# Patient Record
Sex: Female | Born: 1985 | Hispanic: Yes | Marital: Single | Smoking: Never smoker
Health system: Southern US, Community
[De-identification: ages and names within clinical notes are randomized; demographics above are authoritative.]

## PROBLEM LIST (undated history)

## (undated) ENCOUNTER — Inpatient Hospital Stay: Payer: Self-pay

## (undated) DIAGNOSIS — N6489 Other specified disorders of breast: Secondary | ICD-10-CM

## (undated) DIAGNOSIS — Z87440 Personal history of urinary (tract) infections: Secondary | ICD-10-CM

## (undated) DIAGNOSIS — Z8759 Personal history of other complications of pregnancy, childbirth and the puerperium: Secondary | ICD-10-CM

## (undated) HISTORY — DX: Personal history of other complications of pregnancy, childbirth and the puerperium: Z87.59

## (undated) HISTORY — DX: Personal history of urinary (tract) infections: Z87.440

## (undated) HISTORY — DX: Other specified disorders of breast: N64.89

---

## 2007-07-09 ENCOUNTER — Emergency Department: Payer: Self-pay | Admitting: Emergency Medicine

## 2009-03-03 ENCOUNTER — Ambulatory Visit: Payer: Self-pay | Admitting: Family Medicine

## 2009-04-23 ENCOUNTER — Ambulatory Visit: Payer: Self-pay | Admitting: Certified Nurse Midwife

## 2009-07-10 ENCOUNTER — Observation Stay: Payer: Self-pay

## 2016-01-27 LAB — OB RESULTS CONSOLE PLATELET COUNT: Platelets: 237 10*3/uL

## 2016-01-27 LAB — OB RESULTS CONSOLE HGB/HCT, BLOOD
HCT: 33 %
Hemoglobin: 11.4 g/dL

## 2016-01-27 LAB — OB RESULTS CONSOLE GC/CHLAMYDIA
CHLAMYDIA, DNA PROBE: NEGATIVE
Gonorrhea: NEGATIVE

## 2016-01-27 LAB — OB RESULTS CONSOLE RUBELLA ANTIBODY, IGM: RUBELLA: IMMUNE

## 2016-01-27 LAB — OB RESULTS CONSOLE ABO/RH: RH TYPE: POSITIVE

## 2016-01-27 LAB — OB RESULTS CONSOLE HIV ANTIBODY (ROUTINE TESTING): HIV: NONREACTIVE

## 2016-01-27 LAB — OB RESULTS CONSOLE RPR: RPR: NONREACTIVE

## 2016-01-27 LAB — OB RESULTS CONSOLE HEPATITIS B SURFACE ANTIGEN: Hepatitis B Surface Ag: NEGATIVE

## 2016-01-27 LAB — SICKLE CELL SCREEN: SICKLE CELL SCREEN: NORMAL

## 2016-01-27 LAB — OB RESULTS CONSOLE ANTIBODY SCREEN: ANTIBODY SCREEN: NEGATIVE

## 2016-01-27 LAB — OB RESULTS CONSOLE VARICELLA ZOSTER ANTIBODY, IGG: VARICELLA IGG: IMMUNE

## 2016-02-29 ENCOUNTER — Other Ambulatory Visit: Payer: Self-pay | Admitting: Certified Nurse Midwife

## 2016-02-29 DIAGNOSIS — N6489 Other specified disorders of breast: Secondary | ICD-10-CM

## 2016-03-07 ENCOUNTER — Ambulatory Visit
Admission: RE | Admit: 2016-03-07 | Discharge: 2016-03-07 | Disposition: A | Discharge status: Home / Self Care | Payer: Managed Care, Other (non HMO) | Source: Ambulatory Visit | Attending: Certified Nurse Midwife | Admitting: Certified Nurse Midwife

## 2016-03-07 ENCOUNTER — Encounter (HOSPITAL_COMMUNITY): Payer: Self-pay

## 2016-03-07 DIAGNOSIS — N6489 Other specified disorders of breast: Secondary | ICD-10-CM | POA: Insufficient documentation

## 2016-03-07 HISTORY — DX: Other specified disorders of breast: N64.89

## 2016-04-25 NOTE — L&D Delivery Note (Signed)
Delivery Note At 8:44 AM a viable female was delivered via Vaginal, Spontaneous Delivery (Presentation: OA).  APGAR: 7, 9; weight 7 lb 14.6 oz (3590 g).   Placenta status: spontaneous, intact.  Cord: 3VC without following complications: .  Cord pH: N/A  Anesthesia:  None Episiotomy:  None Lacerations:  none Suture Repair: N/A Est. Blood Loss (mL):  200mL  Mom to postpartum.  Baby to Couplet care / Skin to Skin.  Vena Austriandreas Aminah Zabawa 09/09/2016, 9:04 AM

## 2016-06-20 LAB — OB RESULTS CONSOLE HGB/HCT, BLOOD
HCT: 35 %
Hemoglobin: 11.4 g/dL

## 2016-06-20 LAB — OB RESULTS CONSOLE RPR: RPR: NONREACTIVE

## 2016-06-20 LAB — OB RESULTS CONSOLE PLATELET COUNT: PLATELETS: 180 10*3/uL

## 2016-06-20 LAB — OB RESULTS CONSOLE HIV ANTIBODY (ROUTINE TESTING): HIV: NONREACTIVE

## 2016-07-01 DIAGNOSIS — N159 Renal tubulo-interstitial disease, unspecified: Secondary | ICD-10-CM | POA: Insufficient documentation

## 2016-07-01 LAB — GLUCOSE, 1 HOUR GESTATIONAL: GLUCOSE 1 HOUR: 93

## 2016-07-04 ENCOUNTER — Encounter: Payer: Self-pay | Admitting: Obstetrics and Gynecology

## 2016-07-07 ENCOUNTER — Ambulatory Visit (INDEPENDENT_AMBULATORY_CARE_PROVIDER_SITE_OTHER): Payer: Managed Care, Other (non HMO) | Admitting: Obstetrics & Gynecology

## 2016-07-07 VITALS — BP 90/60 | Wt 109.0 lb

## 2016-07-07 DIAGNOSIS — Z23 Encounter for immunization: Secondary | ICD-10-CM

## 2016-07-07 DIAGNOSIS — Z3490 Encounter for supervision of normal pregnancy, unspecified, unspecified trimester: Secondary | ICD-10-CM

## 2016-07-07 DIAGNOSIS — Z3A31 31 weeks gestation of pregnancy: Secondary | ICD-10-CM

## 2016-07-07 NOTE — Addendum Note (Signed)
Addended by: Cornelius MorasPATTERSON, Navaya Wiatrek D on: 07/07/2016 02:58 PM   Modules accepted: Orders

## 2016-07-07 NOTE — Progress Notes (Signed)
TDaP today.  No c/os.  PNV, FMC, PTL precautions, Back pain discussed (heat, compression, Tylenol).

## 2016-07-15 ENCOUNTER — Telehealth: Payer: Self-pay

## 2016-07-15 NOTE — Telephone Encounter (Signed)
FMLA/DISABILITY form for [employer] filled out and given to [place] for processing.

## 2016-07-21 ENCOUNTER — Ambulatory Visit (INDEPENDENT_AMBULATORY_CARE_PROVIDER_SITE_OTHER): Payer: Managed Care, Other (non HMO) | Admitting: Advanced Practice Midwife

## 2016-07-21 VITALS — BP 98/56 | Wt 110.0 lb

## 2016-07-21 DIAGNOSIS — Z3A33 33 weeks gestation of pregnancy: Secondary | ICD-10-CM

## 2016-07-21 NOTE — Progress Notes (Signed)
Doing well. Discussion regarding when to go out of work. Pt plans for around 36 weeks and will need a note at next visit. Pt desires BTL- signing consent today. Measuring 30 cm today. Growth scan nv.

## 2016-08-04 ENCOUNTER — Ambulatory Visit (INDEPENDENT_AMBULATORY_CARE_PROVIDER_SITE_OTHER): Payer: Managed Care, Other (non HMO)

## 2016-08-04 ENCOUNTER — Ambulatory Visit (INDEPENDENT_AMBULATORY_CARE_PROVIDER_SITE_OTHER): Payer: Managed Care, Other (non HMO) | Admitting: Advanced Practice Midwife

## 2016-08-04 VITALS — BP 108/58 | Wt 110.0 lb

## 2016-08-04 DIAGNOSIS — R300 Dysuria: Secondary | ICD-10-CM

## 2016-08-04 DIAGNOSIS — Z3A38 38 weeks gestation of pregnancy: Secondary | ICD-10-CM | POA: Diagnosis not present

## 2016-08-04 DIAGNOSIS — O365930 Maternal care for other known or suspected poor fetal growth, third trimester, not applicable or unspecified: Secondary | ICD-10-CM | POA: Diagnosis not present

## 2016-08-04 DIAGNOSIS — Z3A35 35 weeks gestation of pregnancy: Secondary | ICD-10-CM

## 2016-08-04 LAB — POCT URINALYSIS DIPSTICK
Bilirubin, UA: NEGATIVE
Blood, UA: NEGATIVE
Glucose, UA: NEGATIVE
Ketones, UA: NEGATIVE
LEUKOCYTES UA: NEGATIVE
NITRITE UA: NEGATIVE
PH UA: 7.5 (ref 5.0–8.0)
PROTEIN UA: NEGATIVE
Spec Grav, UA: 1.02 (ref 1.010–1.025)
UROBILINOGEN UA: NEGATIVE U/dL — AB

## 2016-08-04 NOTE — Progress Notes (Signed)
c/o frequent urination- denies burning or irritation. Urine is light yellow and not cloudy and negative on dip. F/U scan today for growth and placentation. Baby is 6 pounds, 50%, AFI 11cm. Placenta measures 1.72cm from cervix. Work letter given to day for last day 4/14 until after pp recovery.

## 2016-08-04 NOTE — Progress Notes (Signed)
Lower back pain 

## 2016-08-12 ENCOUNTER — Ambulatory Visit (INDEPENDENT_AMBULATORY_CARE_PROVIDER_SITE_OTHER): Payer: Managed Care, Other (non HMO) | Admitting: Obstetrics and Gynecology

## 2016-08-12 VITALS — BP 100/60 | Wt 111.0 lb

## 2016-08-12 DIAGNOSIS — Z3490 Encounter for supervision of normal pregnancy, unspecified, unspecified trimester: Secondary | ICD-10-CM

## 2016-08-12 DIAGNOSIS — Z3A36 36 weeks gestation of pregnancy: Secondary | ICD-10-CM

## 2016-08-12 DIAGNOSIS — O4440 Low lying placenta NOS or without hemorrhage, unspecified trimester: Secondary | ICD-10-CM | POA: Insufficient documentation

## 2016-08-12 NOTE — Progress Notes (Signed)
No vb. No lof. Aptima/GBS today. Verified desire for BTL U/s nv for placentation with LLP

## 2016-08-16 LAB — STREP GP B NAA: Strep Gp B NAA: NEGATIVE

## 2016-08-16 LAB — GC/CHLAMYDIA PROBE AMP
Chlamydia trachomatis, NAA: NEGATIVE
NEISSERIA GONORRHOEAE BY PCR: NEGATIVE

## 2016-08-19 ENCOUNTER — Ambulatory Visit (INDEPENDENT_AMBULATORY_CARE_PROVIDER_SITE_OTHER): Payer: Managed Care, Other (non HMO) | Admitting: Certified Nurse Midwife

## 2016-08-19 ENCOUNTER — Ambulatory Visit (INDEPENDENT_AMBULATORY_CARE_PROVIDER_SITE_OTHER): Payer: Managed Care, Other (non HMO)

## 2016-08-19 VITALS — BP 90/60 | Ht 60.0 in | Wt 110.0 lb

## 2016-08-19 DIAGNOSIS — Z3A37 37 weeks gestation of pregnancy: Secondary | ICD-10-CM

## 2016-08-19 DIAGNOSIS — Z3490 Encounter for supervision of normal pregnancy, unspecified, unspecified trimester: Secondary | ICD-10-CM

## 2016-08-19 DIAGNOSIS — O4440 Low lying placenta NOS or without hemorrhage, unspecified trimester: Secondary | ICD-10-CM

## 2016-08-19 NOTE — Progress Notes (Signed)
U/S f/u today for placentation with LLP. Pt reports no problems.

## 2016-08-19 NOTE — Progress Notes (Signed)
Ultrasound today for placental location: posterior placenta 2.33cm from os advised patient that it is OK to try to deliver vaginally. If bleeding excessively as cervix dilates, Cesarean section would be done. Baby active. Having some irregular contractions and pain over symphysis pubis [facility]KC instructions given. Labor precautions. Breast and bottle/ BTL GBS negative ROB 1 weekx

## 2016-08-29 ENCOUNTER — Ambulatory Visit (INDEPENDENT_AMBULATORY_CARE_PROVIDER_SITE_OTHER): Payer: Managed Care, Other (non HMO) | Admitting: Advanced Practice Midwife

## 2016-08-29 VITALS — BP 100/62 | Wt 115.0 lb

## 2016-08-29 DIAGNOSIS — Z3A39 39 weeks gestation of pregnancy: Secondary | ICD-10-CM

## 2016-08-29 NOTE — Progress Notes (Signed)
Doing well today. Denies LOF, VB. Having some irregular contractions. Declines cervical exam today.

## 2016-08-29 NOTE — Patient Instructions (Signed)

## 2016-09-05 ENCOUNTER — Ambulatory Visit (INDEPENDENT_AMBULATORY_CARE_PROVIDER_SITE_OTHER): Payer: Managed Care, Other (non HMO) | Admitting: Obstetrics & Gynecology

## 2016-09-05 VITALS — BP 100/60 | Wt 114.0 lb

## 2016-09-05 DIAGNOSIS — Z3A40 40 weeks gestation of pregnancy: Secondary | ICD-10-CM

## 2016-09-05 DIAGNOSIS — Z3490 Encounter for supervision of normal pregnancy, unspecified, unspecified trimester: Secondary | ICD-10-CM

## 2016-09-05 NOTE — Progress Notes (Signed)
PNV, FMC, Labor precautions, Membranes stripped today, NST/AFI Thurs if still pregnant, IOL discussed for 41 weeks if needed (not yet scheduled)

## 2016-09-06 ENCOUNTER — Inpatient Hospital Stay
Admission: EM | Admit: 2016-09-06 | Discharge: 2016-09-06 | Disposition: A | Discharge status: Home / Self Care | Payer: Managed Care, Other (non HMO) | Source: Home / Self Care | Admitting: Obstetrics and Gynecology

## 2016-09-06 ENCOUNTER — Encounter: Payer: Self-pay | Admitting: *Deleted

## 2016-09-06 DIAGNOSIS — O368130 Decreased fetal movements, third trimester, not applicable or unspecified: Secondary | ICD-10-CM | POA: Diagnosis not present

## 2016-09-06 DIAGNOSIS — O368190 Decreased fetal movements, unspecified trimester, not applicable or unspecified: Secondary | ICD-10-CM

## 2016-09-06 DIAGNOSIS — Z3A40 40 weeks gestation of pregnancy: Secondary | ICD-10-CM | POA: Diagnosis not present

## 2016-09-06 NOTE — Discharge Instructions (Signed)
You are scheduled for induction of labor on 09/12/16 at 0800 AM.  Please call Labor and Delivery before coming to make sure there is a bed available.  The number is [phone].

## 2016-09-06 NOTE — OB Triage Note (Signed)
Recvd to OBS 3 per wheelchair from ED for c/o decreased fetal movement since last PM.  Changed to gown and to bed.  EFM applied.  Physical assessment completed.  Oriented to room and plan of care discussed.  Verbalized understanding and agrees with plan.

## 2016-09-06 NOTE — Progress Notes (Signed)
Discharge instructions given and explained.  Verbalized understanding.  Questions answered.  Signed copy on chart.  

## 2016-09-06 NOTE — Final Progress Note (Signed)
Physician Final Progress Note  Patient ID: Rebecca Sheppard MRN: [id number] DOB/AGE: 30/06/1985 30 y.o.  Admit date: 09/06/2016 Admitting provider: Vena AustriaAndreas Staebler, MD/ Rebecca Sheppard, CNM Discharge date: 09/06/2016   Admission Diagnoses: Decreased fetal movement at 40.1 weeks  Discharge Diagnoses: Reactive Non stress test  Consults: None  Significant Findings/ Diagnostic Studies: 31 year old G2 P1001 with EDC=09/05/2016 by LMP=11/30/2015 presents at 1940 wk1d with decreased fetal movement this AM. Seen in office yesterday and membranes were striped. Contracted through the night, but contractions spaced out this AM. No vaginal bleeding or LOF. Has not eaten or drank this AM. [facility] for early care, concerns for a low lying placenta (resolved on 4/27 ultrasound with placenta 2.33cm from os), history of pyelo with G1,  And a 19# weight gain with starting BMI=18.55 kg/m2. Social: Non smoker/ no alcohol Family HX: negative for ovarian or breast cancer  Exam: General: gravid female in NAD BP 100/69 (BP Location: Left Arm)   Pulse 75   Temp 97.7 F (36.5 C) (Oral)   Resp 16   Ht 5' (1.524 m)   Wt 51.7 kg (114 lb)   LMP 11/30/2015   BMI 22.26 kg/m    ABD: Vertex/ EFW 7# FHR: 120 baseline with accelerations to 150s, moderate variability Toco: occasional contraction, mostly uterine irritability Cervix: 3/80%/-1  A: IUP at 40wk1 days with reactive NST  P: D/C home with labor precautions [facility]-will need orders and H&P Rebecca Sheppard, CNM  Procedures: NST  Discharge Condition: stable  Disposition: Final discharge disposition not confirmed  Diet: Regular diet  Discharge Activity: Activity as tolerated  Discharge Instructions    Discharge patient    Complete by:  As directed    Discharge disposition:  01-Home or Self Care   Discharge patient date:  09/06/2016     Allergies  as of 09/06/2016   No Known Allergies     Medication List    STOP taking these medications   FLUARIX QUADRIVALENT 0.5 ML injection Generic drug:  Influenza vac split quadrivalent PF     TAKE these medications   prenatal multivitamin Tabs tablet Take 1 tablet by mouth daily at 12 noon.        Total time spent taking care of this patient: 20 minutes  Signed: Farrel Connersolleen Delaynee Sheppard 09/06/2016, 9:56 AM

## 2016-09-08 ENCOUNTER — Ambulatory Visit (INDEPENDENT_AMBULATORY_CARE_PROVIDER_SITE_OTHER): Payer: Managed Care, Other (non HMO)

## 2016-09-08 ENCOUNTER — Other Ambulatory Visit: Payer: Self-pay | Admitting: Obstetrics & Gynecology

## 2016-09-08 ENCOUNTER — Ambulatory Visit (INDEPENDENT_AMBULATORY_CARE_PROVIDER_SITE_OTHER): Payer: Managed Care, Other (non HMO) | Admitting: Obstetrics and Gynecology

## 2016-09-08 VITALS — BP 110/68 | Wt 116.0 lb

## 2016-09-08 DIAGNOSIS — Z3490 Encounter for supervision of normal pregnancy, unspecified, unspecified trimester: Secondary | ICD-10-CM

## 2016-09-08 DIAGNOSIS — Z3A40 40 weeks gestation of pregnancy: Secondary | ICD-10-CM

## 2016-09-08 DIAGNOSIS — O480 Post-term pregnancy: Secondary | ICD-10-CM | POA: Diagnosis not present

## 2016-09-08 DIAGNOSIS — Z3483 Encounter for supervision of other normal pregnancy, third trimester: Secondary | ICD-10-CM

## 2016-09-08 NOTE — Progress Notes (Signed)
Obstetric H&P   Chief Complaint: IOL  Prenatal Care Provider: [facility]  History of Present Illness: 31 y.o. G2P1001 6947w3d by 09/05/2016, by Last Menstrual Period presenting to for postdates surveillance.  +FM, no LOF, no VB. Pregnancy thus far uncomplicated.  Low lying placenta on initial scans resolved on follow up imaging     ABO, Rh: O/Positive/-- (10/04 0000)  Antibody: Negative (10/04 0000)  Rubella: Immune  Varicella: Immune RPR: Nonreactive (02/26 0000)  HBsAg: Negative (10/04 0000)  HIV: Non-reactive (02/26 0000)  1-hr: 93 GBS: Negative (04/20 1441)   Review of Systems: 10 point review of systems negative unless otherwise noted in HPI  Past Medical History: Past Medical History:  Diagnosis Date  . Breast asymmetry in female 03/07/2016   targed u/s showed dense tissue within the left brease @ 11 o'clock 5 cm from the niple. No suspicious abnormality identified.  Marland Kitchen. History of kidney infection   . History of maternal pyelonephritis    G1 pregnancy    Past Surgical History: No past surgical history on file.   Family History: No family history on file.  Social History: Social History   Social History  . Marital status: Single    Spouse name: N/A  . Number of children: N/A  . Years of education: N/A   Occupational History  . Not on file.   Social History Main Topics  . Smoking status: Never Smoker  . Smokeless tobacco: Never Used  . Alcohol use No  . Drug use: No  . Sexual activity: Yes   Other Topics Concern  . Not on file   Social History Narrative  . No narrative on file    Medications: Prior to Admission medications   Medication Sig Start Date End Date Taking? Authorizing Provider  Prenatal Vit-Fe Fumarate-FA (PRENATAL MULTIVITAMIN) TABS tablet Take 1 tablet by mouth daily at 12 noon.    [provider]    Allergies: No Known Allergies  Physical Exam: Vitals: Blood pressure 110/68, weight 116 lb (52.6 kg), last menstrual period  11/30/2015.  Baseline: 120 Variability: moderate Accelerations: present Decelerations: absent Tocometry: N/A The patient was monitored for 30 minutes, fetal heart rate tracing was deemed reactive, category I tracing,   General: NAD HEENT: normocephalic, anicteric Pulmonary: No increased work of breathing Cardiovascular: RRR, distal pulses 2+ Abdomen: Gravid, non-tender Leopolds:vtx Genitourinary: 4/70/-2  Extremities: no edema, erythema, or tenderness Neurologic: Grossly intact Psychiatric: mood appropriate, affect full  Labs: No results found for this or any previous visit (from the past 24 hour(s)).  Assessment: 31 y.o. G2P1001 7247w3d by 09/05/2016, presenting for post dates surveillance  Plan: 1) Labor - proceed with pitocin IOL 09/12/16 if not deilvereyd  2) Fetus - reactive NST today  3) PNL - see labs above  4) TDAP - 07/07/16  5) Breast  & Bottle / BTL  6) Disposition - IOL scheduled 4/21

## 2016-09-08 NOTE — Progress Notes (Signed)
AFI/NST 

## 2016-09-09 ENCOUNTER — Inpatient Hospital Stay
Admission: EM | Admit: 2016-09-09 | Discharge: 2016-09-11 | DRG: 767 | Disposition: A | Discharge status: Home / Self Care | Payer: Managed Care, Other (non HMO) | Attending: Obstetrics and Gynecology | Admitting: Obstetrics and Gynecology

## 2016-09-09 DIAGNOSIS — O9081 Anemia of the puerperium: Secondary | ICD-10-CM | POA: Diagnosis not present

## 2016-09-09 DIAGNOSIS — O480 Post-term pregnancy: Secondary | ICD-10-CM | POA: Diagnosis not present

## 2016-09-09 DIAGNOSIS — Z302 Encounter for sterilization: Secondary | ICD-10-CM

## 2016-09-09 DIAGNOSIS — O368130 Decreased fetal movements, third trimester, not applicable or unspecified: Secondary | ICD-10-CM | POA: Diagnosis present

## 2016-09-09 DIAGNOSIS — O4292 Full-term premature rupture of membranes, unspecified as to length of time between rupture and onset of labor: Secondary | ICD-10-CM | POA: Diagnosis present

## 2016-09-09 DIAGNOSIS — Z3A40 40 weeks gestation of pregnancy: Secondary | ICD-10-CM

## 2016-09-09 LAB — CBC
HCT: 37.9 % (ref 35.0–47.0)
Hemoglobin: 12.8 g/dL (ref 12.0–16.0)
MCH: 29.6 pg (ref 26.0–34.0)
MCHC: 33.8 g/dL (ref 32.0–36.0)
MCV: 87.5 fL (ref 80.0–100.0)
PLATELETS: 167 10*3/uL (ref 150–440)
RBC: 4.33 MIL/uL (ref 3.80–5.20)
RDW: 14.1 % (ref 11.5–14.5)
WBC: 6.9 10*3/uL (ref 3.6–11.0)

## 2016-09-09 LAB — TYPE AND SCREEN
ABO/RH(D): O POS
ANTIBODY SCREEN: NEGATIVE

## 2016-09-09 MED ORDER — LACTATED RINGERS IV SOLN: 500.0000 mL | INTRAVENOUS | Status: DC | PRN

## 2016-09-09 MED ORDER — DIPHENHYDRAMINE HCL 25 MG PO CAPS: 25.0000 mg | ORAL_CAPSULE | Freq: Four times a day (QID) | ORAL | Status: DC | PRN

## 2016-09-09 MED ORDER — OXYCODONE-ACETAMINOPHEN 5-325 MG PO TABS: 1.0000 | ORAL_TABLET | ORAL | Status: DC | PRN

## 2016-09-09 MED ORDER — ONDANSETRON HCL 4 MG/2ML IJ SOLN: 4.0000 mg | INTRAMUSCULAR | Status: DC | PRN

## 2016-09-09 MED ORDER — SOD CITRATE-CITRIC ACID 500-334 MG/5ML PO SOLN
30.0000 mL | ORAL | Status: DC | PRN
Start: 2016-09-09 — End: 2016-09-09

## 2016-09-09 MED ORDER — IBUPROFEN 600 MG PO TABS
600.0000 mg | ORAL_TABLET | Freq: Four times a day (QID) | ORAL | Status: DC
  Administered 2016-09-09 – 2016-09-11 (×8): 600 mg via ORAL
  Filled 2016-09-09 (×8): qty 1

## 2016-09-09 MED ORDER — AMMONIA AROMATIC IN INHA
RESPIRATORY_TRACT | Status: AC
  Filled 2016-09-09: qty 10

## 2016-09-09 MED ORDER — ACETAMINOPHEN 325 MG PO TABS: 650.0000 mg | ORAL_TABLET | ORAL | Status: DC | PRN

## 2016-09-09 MED ORDER — OXYTOCIN BOLUS FROM INFUSION
500.0000 mL | Freq: Once | INTRAVENOUS | Status: AC
  Administered 2016-09-09: 500 mL via INTRAVENOUS

## 2016-09-09 MED ORDER — MISOPROSTOL 200 MCG PO TABS
ORAL_TABLET | ORAL | Status: AC
  Filled 2016-09-09: qty 4

## 2016-09-09 MED ORDER — LIDOCAINE HCL (PF) 1 % IJ SOLN: 30.0000 mL | INTRAMUSCULAR | Status: DC | PRN

## 2016-09-09 MED ORDER — DIBUCAINE 1 % RE OINT: 1.0000 "application " | TOPICAL_OINTMENT | RECTAL | Status: DC | PRN

## 2016-09-09 MED ORDER — FAMOTIDINE 20 MG PO TABS
40.0000 mg | ORAL_TABLET | Freq: Once | ORAL | Status: AC
  Administered 2016-09-10: 40 mg via ORAL
  Filled 2016-09-09: qty 2

## 2016-09-09 MED ORDER — SIMETHICONE 80 MG PO CHEW: 80.0000 mg | CHEWABLE_TABLET | ORAL | Status: DC | PRN

## 2016-09-09 MED ORDER — LIDOCAINE HCL (PF) 1 % IJ SOLN
INTRAMUSCULAR | Status: AC
  Filled 2016-09-09: qty 30

## 2016-09-09 MED ORDER — SENNOSIDES-DOCUSATE SODIUM 8.6-50 MG PO TABS
2.0000 | ORAL_TABLET | ORAL | Status: DC
  Administered 2016-09-10 – 2016-09-11 (×2): 2 via ORAL
  Filled 2016-09-09 (×2): qty 2

## 2016-09-09 MED ORDER — ONDANSETRON HCL 4 MG PO TABS: 4.0000 mg | ORAL_TABLET | ORAL | Status: DC | PRN

## 2016-09-09 MED ORDER — COCONUT OIL OIL
1.0000 "application " | TOPICAL_OIL | Status: DC | PRN
  Administered 2016-09-11: 1 via TOPICAL
  Filled 2016-09-09: qty 120

## 2016-09-09 MED ORDER — PRENATAL MULTIVITAMIN CH
1.0000 | ORAL_TABLET | Freq: Every day | ORAL | Status: DC
  Administered 2016-09-11: 1 via ORAL
  Filled 2016-09-09: qty 1

## 2016-09-09 MED ORDER — BENZOCAINE-MENTHOL 20-0.5 % EX AERO
1.0000 | INHALATION_SPRAY | CUTANEOUS | Status: DC | PRN
Start: 2016-09-09 — End: 2016-09-11

## 2016-09-09 MED ORDER — OXYTOCIN 40 UNITS IN LACTATED RINGERS INFUSION - SIMPLE MED
2.5000 [IU]/h | INTRAVENOUS | Status: DC
  Administered 2016-09-09: 2.5 [IU]/h via INTRAVENOUS
  Filled 2016-09-09: qty 1000

## 2016-09-09 MED ORDER — OXYCODONE-ACETAMINOPHEN 5-325 MG PO TABS
2.0000 | ORAL_TABLET | ORAL | Status: DC | PRN
  Administered 2016-09-10 – 2016-09-11 (×5): 2 via ORAL
  Filled 2016-09-09 (×5): qty 2

## 2016-09-09 MED ORDER — OXYTOCIN 10 UNIT/ML IJ SOLN
INTRAMUSCULAR | Status: AC
  Filled 2016-09-09: qty 2

## 2016-09-09 MED ORDER — LACTATED RINGERS IV SOLN
INTRAVENOUS | Status: DC
  Administered 2016-09-09: 18:00:00 via INTRAVENOUS

## 2016-09-09 MED ORDER — LACTATED RINGERS IV SOLN
INTRAVENOUS | Status: DC
  Administered 2016-09-09: 08:00:00 via INTRAVENOUS

## 2016-09-09 MED ORDER — WITCH HAZEL-GLYCERIN EX PADS: 1.0000 "application " | MEDICATED_PAD | CUTANEOUS | Status: DC | PRN

## 2016-09-09 MED ORDER — BUTORPHANOL TARTRATE 2 MG/ML IJ SOLN
1.0000 mg | INTRAMUSCULAR | Status: DC | PRN
Start: 2016-09-09 — End: 2016-09-09

## 2016-09-09 MED ORDER — METOCLOPRAMIDE HCL 10 MG PO TABS
10.0000 mg | ORAL_TABLET | Freq: Once | ORAL | Status: AC
  Administered 2016-09-10: 10 mg via ORAL
  Filled 2016-09-09: qty 1

## 2016-09-09 MED ORDER — ONDANSETRON HCL 4 MG/2ML IJ SOLN: 4.0000 mg | Freq: Four times a day (QID) | INTRAMUSCULAR | Status: DC | PRN

## 2016-09-09 NOTE — H&P (Signed)
Date of Initial H&P: 09/08/2016  History reviewed, patient examined, change in status: presented with clear SROM and term labor prior to scheduled post-dates induction at 4746w4d today.

## 2016-09-09 NOTE — Lactation Note (Signed)
This note was copied from a baby's chart. Lactation Consultation Note  Patient Name: Boy Verita Schneidersoemi Morales Pimentel [id number]'XToday's Date: 09/09/2016 Reason for consult: Initial assessment   Maternal Data Formula Feeding for Exclusion: No Does the patient have breastfeeding experience prior to this delivery?: Yes Pt states she has no milk and therefore is feeding formula, encouraged to offer breasts every 2-3 hrs, only supplement small amts after breastfeeding, informed that she has milk, can hear swallows and that frequent breastfeeding makes more milk  Feeding Feeding Type: Bottle Fed - Formula Nipple Type: Slow - flow Length of feed: 30 min (both breasts)  LATCH Score/Interventions Latch: Grasps breast easily, tongue down, lips flanged, rhythmical sucking.  Audible Swallowing: Spontaneous and intermittent  Type of Nipple: Everted at rest and after stimulation  Comfort (Breast/Nipple): Soft / non-tender     Hold (Positioning): No assistance needed to correctly position infant at breast.  LATCH Score: 10  Lactation Tools Discussed/Used     Consult Status Consult Status: Follow-up Date: 09/10/16 Follow-up type: In-patient    Dyann KiefMarsha D Yaneli Keithley 09/09/2016, 4:51 PM

## 2016-09-09 NOTE — Discharge Summary (Signed)
Obstetric Discharge Summary Reason for Admission: onset of labor Prenatal Procedures: none Intrapartum Procedures: spontaneous vaginal delivery Postpartum Procedures: P.P. tubal ligation Complications-Operative and Postpartum: none Hemoglobin  Date Value Ref Range Status  09/10/2016 11.5 (L) 12.0 - 16.0 g/dL Final  16/10/960402/26/2018 54.011.4 g/dL Final   HCT  Date Value Ref Range Status  09/10/2016 32.9 (L) 35.0 - 47.0 % Final  06/20/2016 35 % Final   BP (!) 121/55 (BP Location: Right Arm)   Pulse (!) 57   Temp 97.5 F (36.4 C) (Oral)   Resp 16   Ht 5' (1.524 m)   Wt 116 lb (52.6 kg)   LMP 11/30/2015   SpO2 100%   Breastfeeding? Unknown   BMI 22.65 kg/m   Physical Exam:  General: alert, cooperative and no distress Lochia: appropriate Uterine Fundus: firm Incision: healing well, no significant drainage, no significant erythema DVT Evaluation: No evidence of DVT seen on physical exam. No cords or calf tenderness. No significant calf/ankle edema.  Discharge Diagnoses: Term Pregnancy-delivered  Discharge Information: Date: 09/11/2016 Activity: pelvic rest Diet: routine Allergies as of 09/11/2016   No Known Allergies     Medication List    TAKE these medications   HYDROcodone-acetaminophen 5-325 MG tablet Commonly known as:  NORCO Take 1 tablet by mouth every 6 (six) hours as needed for moderate pain.   ibuprofen 600 MG tablet Commonly known as:  ADVIL,MOTRIN Take 1 tablet (600 mg total) by mouth every 6 (six) hours.   prenatal multivitamin Tabs tablet Take 1 tablet by mouth daily at 12 noon.       Condition: stable Discharge to: home Follow-up Information    Vena AustriaStaebler, Andreas, MD Follow up in 6 week(s).   Specialty:  Obstetrics and Gynecology Why:  6 week postpartum visit Contact information: [address] [place] [address] [phone]           Newborn Data: Live born female  Birth Weight: 7 lb 14.6 oz (3590 g) APGAR: 7, 9  Home with  mother.  Thomasene MohairStephen Jayley Hustead, MD 09/11/2016, 7:34 AM

## 2016-09-10 ENCOUNTER — Inpatient Hospital Stay: Payer: Managed Care, Other (non HMO) | Admitting: Anesthesiology

## 2016-09-10 ENCOUNTER — Encounter
Admission: EM | Disposition: A | Discharge status: Home / Self Care | Payer: Self-pay | Source: Home / Self Care | Attending: Obstetrics and Gynecology

## 2016-09-10 DIAGNOSIS — Z302 Encounter for sterilization: Secondary | ICD-10-CM

## 2016-09-10 HISTORY — PX: TUBAL LIGATION: SHX77

## 2016-09-10 LAB — CBC
HCT: 32.9 % — ABNORMAL LOW (ref 35.0–47.0)
HEMOGLOBIN: 11.5 g/dL — AB (ref 12.0–16.0)
MCH: 30.8 pg (ref 26.0–34.0)
MCHC: 35 g/dL (ref 32.0–36.0)
MCV: 88 fL (ref 80.0–100.0)
Platelets: 151 10*3/uL (ref 150–440)
RBC: 3.74 MIL/uL — AB (ref 3.80–5.20)
RDW: 13.9 % (ref 11.5–14.5)
WBC: 11 10*3/uL (ref 3.6–11.0)

## 2016-09-10 LAB — RPR: RPR: NONREACTIVE

## 2016-09-10 SURGERY — LIGATION, FALLOPIAN TUBE, POSTPARTUM
Anesthesia: General

## 2016-09-10 MED ORDER — BUPIVACAINE-EPINEPHRINE (PF) 0.25% -1:200000 IJ SOLN
INTRAMUSCULAR | Status: AC
Start: 2016-09-10 — End: 2016-09-10
  Filled 2016-09-10: qty 30

## 2016-09-10 MED ORDER — ONDANSETRON HCL 4 MG/2ML IJ SOLN: 4.0000 mg | Freq: Once | INTRAMUSCULAR | Status: DC | PRN

## 2016-09-10 MED ORDER — FENTANYL CITRATE (PF) 100 MCG/2ML IJ SOLN
INTRAMUSCULAR | Status: AC
  Administered 2016-09-10: 25 ug via INTRAVENOUS
  Filled 2016-09-10: qty 2

## 2016-09-10 MED ORDER — ROCURONIUM BROMIDE 100 MG/10ML IV SOLN
INTRAVENOUS | Status: DC | PRN
  Administered 2016-09-10: 5 mg via INTRAVENOUS
  Administered 2016-09-10: 20 mg via INTRAVENOUS

## 2016-09-10 MED ORDER — SUGAMMADEX SODIUM 200 MG/2ML IV SOLN
INTRAVENOUS | Status: DC | PRN
  Administered 2016-09-10: 100 mg via INTRAVENOUS

## 2016-09-10 MED ORDER — FENTANYL CITRATE (PF) 100 MCG/2ML IJ SOLN
INTRAMUSCULAR | Status: AC
  Filled 2016-09-10: qty 2

## 2016-09-10 MED ORDER — HYDROMORPHONE HCL 1 MG/ML IJ SOLN
1.0000 mg | INTRAMUSCULAR | Status: DC | PRN
  Administered 2016-09-10: 1 mg via INTRAVENOUS
  Filled 2016-09-10: qty 1

## 2016-09-10 MED ORDER — SUCCINYLCHOLINE CHLORIDE 20 MG/ML IJ SOLN
INTRAMUSCULAR | Status: AC
  Filled 2016-09-10: qty 1

## 2016-09-10 MED ORDER — ONDANSETRON HCL 4 MG/2ML IJ SOLN
INTRAMUSCULAR | Status: DC | PRN
  Administered 2016-09-10: 4 mg via INTRAVENOUS

## 2016-09-10 MED ORDER — LIDOCAINE HCL (PF) 2 % IJ SOLN
INTRAMUSCULAR | Status: AC
  Filled 2016-09-10: qty 2

## 2016-09-10 MED ORDER — LIDOCAINE HCL (CARDIAC) 20 MG/ML IV SOLN
INTRAVENOUS | Status: DC | PRN
  Administered 2016-09-10: 50 mg via INTRAVENOUS

## 2016-09-10 MED ORDER — PROPOFOL 10 MG/ML IV BOLUS
INTRAVENOUS | Status: DC | PRN
  Administered 2016-09-10: 120 mg via INTRAVENOUS

## 2016-09-10 MED ORDER — FENTANYL CITRATE (PF) 100 MCG/2ML IJ SOLN
25.0000 ug | INTRAMUSCULAR | Status: DC | PRN
  Administered 2016-09-10 (×4): 25 ug via INTRAVENOUS

## 2016-09-10 MED ORDER — MENTHOL 3 MG MT LOZG
1.0000 | LOZENGE | OROMUCOSAL | Status: DC | PRN
  Filled 2016-09-10: qty 9

## 2016-09-10 MED ORDER — FENTANYL CITRATE (PF) 100 MCG/2ML IJ SOLN
INTRAMUSCULAR | Status: DC | PRN
  Administered 2016-09-10 (×2): 25 ug via INTRAVENOUS
  Administered 2016-09-10 (×2): 50 ug via INTRAVENOUS

## 2016-09-10 MED ORDER — BUPIVACAINE-EPINEPHRINE 0.25% -1:200000 IJ SOLN
INTRAMUSCULAR | Status: DC | PRN
  Administered 2016-09-10: 5 mL

## 2016-09-10 MED ORDER — SODIUM CHLORIDE 0.9 % IV SOLN: INTRAVENOUS | Status: DC | PRN

## 2016-09-10 MED ORDER — DEXAMETHASONE SODIUM PHOSPHATE 10 MG/ML IJ SOLN
INTRAMUSCULAR | Status: AC
  Filled 2016-09-10: qty 1

## 2016-09-10 MED ORDER — MIDAZOLAM HCL 2 MG/2ML IJ SOLN
INTRAMUSCULAR | Status: DC | PRN
  Administered 2016-09-10: 1 mg via INTRAVENOUS

## 2016-09-10 MED ORDER — DEXAMETHASONE SODIUM PHOSPHATE 10 MG/ML IJ SOLN
INTRAMUSCULAR | Status: DC | PRN
  Administered 2016-09-10: 8 mg via INTRAVENOUS

## 2016-09-10 MED ORDER — MIDAZOLAM HCL 2 MG/2ML IJ SOLN
INTRAMUSCULAR | Status: AC
  Filled 2016-09-10: qty 2

## 2016-09-10 MED ORDER — SUCCINYLCHOLINE CHLORIDE 20 MG/ML IJ SOLN
INTRAMUSCULAR | Status: DC | PRN
  Administered 2016-09-10: 80 mg via INTRAVENOUS

## 2016-09-10 MED ORDER — PROPOFOL 10 MG/ML IV BOLUS
INTRAVENOUS | Status: AC
  Filled 2016-09-10: qty 20

## 2016-09-10 MED ORDER — LACTATED RINGERS IV SOLN
INTRAVENOUS | Status: DC
  Administered 2016-09-10: 16:00:00 via INTRAVENOUS

## 2016-09-10 MED ORDER — ONDANSETRON HCL 4 MG/2ML IJ SOLN
INTRAMUSCULAR | Status: AC
  Filled 2016-09-10: qty 2

## 2016-09-10 MED ORDER — SUGAMMADEX SODIUM 200 MG/2ML IV SOLN
INTRAVENOUS | Status: AC
Start: 2016-09-10 — End: 2016-09-10
  Filled 2016-09-10: qty 2

## 2016-09-10 SURGICAL SUPPLY — 32 items
BENZOIN TINCTURE PRP APPL 2/3 (GAUZE/BANDAGES/DRESSINGS) ×3 IMPLANT
BLADE SURG SZ11 CARB STEEL (BLADE) ×3 IMPLANT
CHLORAPREP W/TINT 26ML (MISCELLANEOUS) ×3 IMPLANT
DERMABOND ADVANCED (GAUZE/BANDAGES/DRESSINGS) ×2
DERMABOND ADVANCED .7 DNX12 (GAUZE/BANDAGES/DRESSINGS) ×1 IMPLANT
DRAPE LAPAROTOMY 77X122 PED (DRAPES) ×3 IMPLANT
ELECT CAUTERY BLADE 6.4 (BLADE) ×3 IMPLANT
ELECT REM PT RETURN 9FT ADLT (ELECTROSURGICAL) ×3
ELECTRODE REM PT RTRN 9FT ADLT (ELECTROSURGICAL) ×1 IMPLANT
GLOVE BIO SURGEON STRL SZ7 (GLOVE) ×9 IMPLANT
GLOVE BIOGEL PI IND STRL 7.5 (GLOVE) ×1 IMPLANT
GLOVE BIOGEL PI INDICATOR 7.5 (GLOVE) ×2
GOWN STRL REUS W/ TWL LRG LVL3 (GOWN DISPOSABLE) ×1 IMPLANT
GOWN STRL REUS W/ TWL XL LVL3 (GOWN DISPOSABLE) ×1 IMPLANT
GOWN STRL REUS W/TWL LRG LVL3 (GOWN DISPOSABLE) ×2
GOWN STRL REUS W/TWL XL LVL3 (GOWN DISPOSABLE) ×2
KIT RM TURNOVER CYSTO AR (KITS) ×3 IMPLANT
LABEL OR SOLS (LABEL) ×3 IMPLANT
NDL SAFETY 22GX1.5 (NEEDLE) ×3 IMPLANT
NS IRRIG 500ML POUR BTL (IV SOLUTION) ×3 IMPLANT
PACK BASIN MINOR ARMC (MISCELLANEOUS) ×3 IMPLANT
RETRACTOR WOUND ALXS 18CM SML (MISCELLANEOUS) ×1 IMPLANT
RTRCTR WOUND ALEXIS O 18CM SML (MISCELLANEOUS) ×3
SUT MNCRL 4-0 (SUTURE) ×2
SUT MNCRL 4-0 27XMFL (SUTURE) ×1
SUT PLAIN GUT 0 (SUTURE) ×6 IMPLANT
SUT VIC AB 2-0 UR6 27 (SUTURE) ×3 IMPLANT
SUT VIC AB 3-0 SH 27 (SUTURE) ×2
SUT VIC AB 3-0 SH 27X BRD (SUTURE) ×1 IMPLANT
SUT VIC AB 4-0 FS2 27 (SUTURE) ×3 IMPLANT
SUTURE MNCRL 4-0 27XMF (SUTURE) ×1 IMPLANT
SYRINGE 10CC LL (SYRINGE) ×3 IMPLANT

## 2016-09-10 NOTE — Progress Notes (Signed)
Patient ID: Verita SchneidersNoemi Morales Pimentel, female   DOB: 12/28/1985, 31 y.o.   MRN: [id number]  Obstetric Postpartum Daily Progress Note Subjective:  31 y.o. [id number] postpartum day #1 status post vaginal delivery.  She is ambulating, is tolerating po (though she has been NPO since midnight), is voiding spontaneously.  Her pain is well controlled. Her lochia is less than menses.   Medications SCHEDULED MEDICATIONS  . ibuprofen  600 mg Oral Q6H  . prenatal multivitamin  1 tablet Oral Q1200  . senna-docusate  2 tablet Oral Q24H    MEDICATION INFUSIONS  . lactated ringers 20 mL/hr at 09/09/16 1754    PRN MEDICATIONS  acetaminophen, benzocaine-Menthol, coconut oil, witch hazel-glycerin **AND** dibucaine, diphenhydrAMINE, ondansetron **OR** ondansetron (ZOFRAN) IV, oxyCODONE-acetaminophen, oxyCODONE-acetaminophen, simethicone    Objective:   Vitals:   09/09/16 1639 09/09/16 1959 09/10/16 0022 09/10/16 0439  BP: 131/73 114/60 105/74 (!) 93/48  Pulse: 68 65 (!) 59 66  Resp: 17 14 12 16   Temp: 99.5 F (37.5 C) 98.4 F (36.9 C) 98 F (36.7 C) 98 F (36.7 C)  TempSrc: Oral Oral Oral Oral  SpO2:  99% 98% 98%  Weight:      Height:        Current Vital Signs 24h Vital Sign Ranges  T 98 F (36.7 C) Temp  Avg: 98.5 F (36.9 C)  Min: 98 F (36.7 C)  Max: 99.5 F (37.5 C)  BP (!) 93/48 BP  Min: 93/48  Max: 142/84  HR 66 Pulse  Avg: 73.8  Min: 58  Max: 205  RR 16 Resp  Avg: 15  Min: 12  Max: 17  SaO2 98 % Not Delivered SpO2  Avg: 98.8 %  Min: 98 %  Max: 100 %       24 Hour I/O Current Shift I/O  Time Ins Outs 05/18 0701 - 05/19 0700 In: 1057.2 [P.O.:240; I.V.:817.2] Out: 600 [Urine:400] No intake/output data recorded.  General: NAD Pulmonary: no increased work of breathing Abdomen: non-distended, non-tender, fundus firm at level of umbilicus Extremities: no edema, no erythema, no tenderness  Labs:   Recent Labs Lab 09/09/16 0734 09/10/16 0606  WBC 6.9 11.0  HGB 12.8 11.5*  HCT  37.9 32.9*  PLT 167 151     Assessment:   31 y.o. [id number] postpartum day # 1 status post SVD, doing well. Desires permanent sterility.  Plan:   1) Acute blood loss anemia - hemodynamically stable and asymptomatic - po ferrous sulfate  2) O POS / Rubella Immune (10/04 0000)/ Varicella Immune  3) TDAP status up to date (07/07/16)  4) breast and bottle feeding/Contraception = bilateral tubal ligation  31 y.o. [id number]  with undesired fertility, desires permanent sterilization.  Other reversible forms of contraception were discussed with patient; she declines all other modalities. Permanent nature of as well as associated risks of the procedure discussed with patient including but not limited to: risk of regret, permanence of method, bleeding, infection, injury to surrounding organs and need for additional procedures.  Failure risk of 0.5-1% with increased risk of ectopic gestation if pregnancy occurs was also discussed with patient.  She is NPO and is posted. Will take to OR this AM when ready.   5) Disposition  Thomasene MohairStephen Neale Marzette, MD 09/10/2016 8:47 AM

## 2016-09-10 NOTE — Anesthesia Postprocedure Evaluation (Signed)
Anesthesia Post Note  Patient: Rebecca Sheppard  Procedure(s) Performed: Procedure(s) (LRB): POST PARTUM TUBAL LIGATION (N/A)  Patient location during evaluation: PACU Anesthesia Type: General Level of consciousness: awake and alert and oriented Pain management: pain level controlled Vital Signs Assessment: post-procedure vital signs reviewed and stable Respiratory status: spontaneous breathing Cardiovascular status: blood pressure returned to baseline Anesthetic complications: no     Last Vitals:  Vitals:   09/10/16 1204 09/10/16 1205  BP: 109/68 109/68  Pulse: 72 75  Resp: 13 12  Temp:  36.1 C    Last Pain:  Vitals:   09/10/16 1221  TempSrc:   PainSc: 6                  Nupur Hohman

## 2016-09-10 NOTE — Op Note (Signed)
  Operative Note    Pre-Op Diagnosis: multiparity, desires permanent sterilization  Post-Op Diagnosis: multiparity, desires permanent sterilization  Procedures: Postpartum bilateral tubal ligation via Pomeroy technique  Primary Surgeon: Thomasene MohairStephen Jackson, MD    EBL: 5 mL   IVF: 1,000 mL   Specimens: segment of right and left fallopian tubes  Drains: None  Complications: None   Disposition: PACU   Condition: Stable   Findings: normal appearing uterus and bilateral fallopian tubes  Procedure Summary:  The patient was taken to the operating room where general anesthesia is administered and found to be adequate. After timeout was called a small transverse, infraumbilical incision was made with the scalpel. The incision was carried down through the fascia until the peritoneum was identified and entered. The peritoneum was noted to be free of any adhesions and the incision was then extended.  The patient's right fallopian tube was identified, brought incision, and grasped with a Babcock clamp. The tube was then followed out to the fimbria. The Babcock clamp was then used to grasp the tube approximately 4 cm from the cornual region. A 3 cm segment of tube was then ligated with the 2 free ties of plain gut, and excised. Good hemostasis was noted and the tube was returned to the abdomen. The left fallopian tube was then ligated, and a 3 cm segment excised in a similar fashion. Excellent hemostasis was noted, and the tube returned to the abdomen.    The peritoneum and fascia were closed in a single layer using 2-0 Vicryl. The skin was closed in a subcuticular fashion using 4-0 vicryl, undyed. The closure was also closed with Dermabond.  The patient tolerated the procedure well.  Sponge, lap, needle, and instrument counts were correct x 2.  VTE prophylaxis: SCDs. Antibiotic prophylaxis: None given nor indicated. She was awakened in the operating room and was taken to the PACU in stable  condition.   Thomasene MohairStephen Jackson, MD 09/10/2016 11:54 AM

## 2016-09-10 NOTE — Anesthesia Post-op Follow-up Note (Cosign Needed)
Anesthesia QCDR form completed.        

## 2016-09-10 NOTE — Anesthesia Procedure Notes (Signed)
Procedure Name: Intubation Date/Time: 09/10/2016 11:03 AM Performed by: Darlyne Russian Pre-anesthesia Checklist: Patient identified, Emergency Drugs available, Suction available, Patient being monitored and Timeout performed Patient Re-evaluated:Patient Re-evaluated prior to inductionOxygen Delivery Method: Circle system utilized Preoxygenation: Pre-oxygenation with 100% oxygen Intubation Type: IV induction and Cricoid Pressure applied Ventilation: Mask ventilation with difficulty Laryngoscope Size: Mac and 3 Grade View: Grade I Tube type: Oral Tube size: 7.0 mm Number of attempts: 1 Airway Equipment and Method: Stylet Placement Confirmation: ETT inserted through vocal cords under direct vision,  positive ETCO2 and breath sounds checked- equal and bilateral Secured at: 22 cm Tube secured with: Tape Dental Injury: Teeth and Oropharynx as per pre-operative assessment

## 2016-09-10 NOTE — Anesthesia Preprocedure Evaluation (Addendum)
Anesthesia Evaluation  Patient identified by MRN, date of birth, ID band Patient awake    Reviewed: Allergy & Precautions, NPO status , Patient's Chart, lab work & pertinent test results  Airway Mallampati: II  TM Distance: >3 FB     Dental no notable dental hx.    Pulmonary neg pulmonary ROS,    Pulmonary exam normal        Cardiovascular negative cardio ROS Normal cardiovascular exam     Neuro/Psych negative neurological ROS  negative psych ROS   GI/Hepatic negative GI ROS, Neg liver ROS,   Endo/Other  negative endocrine ROS  Renal/GU Hx of kidney infection  negative genitourinary   Musculoskeletal negative musculoskeletal ROS (+)   Abdominal Normal abdominal exam  (+)   Peds negative pediatric ROS (+)  Hematology negative hematology ROS (+)   Anesthesia Other Findings Past Medical History: 03/07/2016: Breast asymmetry in female     Comment: targed u/s showed dense tissue within the left              brease @ 11 o'clock 5 cm from the niple. No               suspicious abnormality identified. No date: History of kidney infection No date: History of maternal pyelonephritis     Comment: G1 pregnancy  Reproductive/Obstetrics                            Anesthesia Physical Anesthesia Plan  ASA: II and emergent  Anesthesia Plan: General   Post-op Pain Management:    Induction: Intravenous and Rapid sequence  Airway Management Planned: Oral ETT  Additional Equipment:   Intra-op Plan:   Post-operative Plan: Extubation in OR  Informed Consent: I have reviewed the patients History and Physical, chart, labs and discussed the procedure including the risks, benefits and alternatives for the proposed anesthesia with the patient or authorized representative who has indicated his/her understanding and acceptance.   Dental advisory given  Plan Discussed with: CRNA and  Surgeon  Anesthesia Plan Comments:         Anesthesia Quick Evaluation

## 2016-09-10 NOTE — Transfer of Care (Signed)
Immediate Anesthesia Transfer of Care Note  Patient: Rebecca Sheppard  Procedure(s) Performed: Procedure(s): POST PARTUM TUBAL LIGATION (N/A)  Patient Location: PACU  Anesthesia Type:General  Level of Consciousness: awake, alert , oriented and patient cooperative  Airway & Oxygen Therapy: Patient Spontanous Breathing and Patient connected to nasal cannula oxygen  Post-op Assessment: Report given to RN and Post -op Vital signs reviewed and stable  Post vital signs: Reviewed and stable  Last Vitals:  Vitals:   09/10/16 1204 09/10/16 1205  BP: 109/68 109/68  Pulse: 72 75  Resp: 13 12  Temp:  36.1 C    Last Pain:  Vitals:   09/10/16 1204  TempSrc: Temporal  PainSc: 5       Patients Stated Pain Goal: 0 (09/09/16 1200)  Complications: No apparent anesthesia complications

## 2016-09-11 ENCOUNTER — Encounter: Payer: Self-pay | Admitting: Obstetrics and Gynecology

## 2016-09-11 MED ORDER — IBUPROFEN 600 MG PO TABS: 600.0000 mg | ORAL_TABLET | Freq: Four times a day (QID) | ORAL | 0 refills | Status: DC

## 2016-09-11 MED ORDER — HYDROCODONE-ACETAMINOPHEN 5-325 MG PO TABS: 1.0000 | ORAL_TABLET | Freq: Four times a day (QID) | ORAL | 0 refills | Status: DC | PRN

## 2016-09-11 NOTE — Progress Notes (Signed)
Discharge instructions complete and prescriptions given. Patient verbalizes understanding of teaching. Patient discharged home at 1200. 

## 2016-09-13 LAB — SURGICAL PATHOLOGY

## 2016-09-14 ENCOUNTER — Telehealth: Payer: Self-pay | Admitting: Obstetrics and Gynecology

## 2016-09-14 NOTE — Telephone Encounter (Signed)
Delivered by [facility]. Thanks!

## 2016-09-14 NOTE — Telephone Encounter (Signed)
Patient asking to have breast pump ordered.  Patient does not have name of company but did have fax [phone] and phone number [phone].

## 2016-09-20 NOTE — Telephone Encounter (Signed)
Form faxed

## 2016-10-20 ENCOUNTER — Ambulatory Visit (INDEPENDENT_AMBULATORY_CARE_PROVIDER_SITE_OTHER): Payer: Managed Care, Other (non HMO) | Admitting: Obstetrics and Gynecology

## 2016-10-20 DIAGNOSIS — Z124 Encounter for screening for malignant neoplasm of cervix: Secondary | ICD-10-CM

## 2016-10-20 NOTE — Progress Notes (Signed)
Postpartum Visit  Chief Complaint:  Chief Complaint  Patient presents with  . Postpartum Care    vaginal delivery 5/18    History of Present Illness: Patient is a 31 y.o. Rebecca Sheppard presents for postpartum visit.  Date of delivery: 09/09/17 Type of delivery: Vaginal delivery - Vacuum or forceps assisted  no Episiotomy No.  Laceration: no  Pregnancy or labor problems:  no Any problems since the delivery:  no  Newborn Details:  SINGLETON :  1. Birth weight: 7lbs 14.6oz 3590g Maternal Details:  Breast Feeding:  yes Post partum depression/anxiety noted:  no Edinburgh Post-Partum Depression Score:  2 Date of last PAP:not on record  Review of Systems: Review of Systems  Constitutional: Negative for chills and fever.  HENT: Negative for congestion.   Respiratory: Negative for cough and shortness of breath.   Cardiovascular: Negative for chest pain and palpitations.  Gastrointestinal: Negative for abdominal pain, constipation, diarrhea, heartburn, nausea and vomiting.  Genitourinary: Negative for dysuria, frequency and urgency.  Skin: Negative for itching and rash.  Neurological: Negative for dizziness and headaches.  Endo/Heme/Allergies: Negative for polydipsia.  Psychiatric/Behavioral: Negative for depression.    Past Medical History:  Past Medical History:  Diagnosis Date  . Breast asymmetry in female 03/07/2016   targed u/s showed dense tissue within the left brease @ 11 o'clock 5 cm from the niple. No suspicious abnormality identified.  Marland Kitchen History of kidney infection   . History of maternal pyelonephritis    G1 pregnancy    Past Surgical History:  Past Surgical History:  Procedure Laterality Date  . TUBAL LIGATION N/A 09/10/2016   Procedure: POST PARTUM TUBAL LIGATION;  Surgeon: Conard Novak, MD;  Location: [facility];  Service: Gynecology;  Laterality: N/A;    Family History:  No family history on file.  Social History:  Social History   Social  History  . Marital status: Single    Spouse name: N/A  . Number of children: N/A  . Years of education: N/A   Occupational History  . Not on file.   Social History Main Topics  . Smoking status: Never Smoker  . Smokeless tobacco: Never Used  . Alcohol use No  . Drug use: No  . Sexual activity: Yes   Other Topics Concern  . Not on file   Social History Narrative  . No narrative on file    Allergies:  No Known Allergies  Medications: Prior to Admission medications   Not on File    Physical Exam Vitals:  Vitals:   10/20/16 1400  BP: (!) 100/52  Pulse: 82    General: NAD HEENT: normocephalic, anicteric Pulmonary: No increased work of breathing Abdomen: NABS, soft, non-tender, non-distended.  Umbilicus without lesions.  No hepatomegaly, splenomegaly or masses palpable. No evidence of hernia. Incision D/C/I Genitourinary:  External: Normal external female genitalia.  Normal urethral meatus, normal  Bartholin's and Skene's glands.    Vagina: Normal vaginal mucosa, no evidence of prolapse.    Cervix: Grossly normal in appearance, no bleeding  Uterus: Non-enlarged, mobile, normal contour.  No CMT  Adnexa: ovaries non-enlarged, no adnexal masses  Rectal: deferred Extremities: no edema, erythema, or tenderness Neurologic: Grossly intact Psychiatric: mood appropriate, affect full  Assessment: 31 y.o. [id number] presenting for 6 week postpartum visit  Plan: Problem List Items Addressed This Visit    None       1) Contraception - s/p BTL 2)  Pap - [facility]P guidelines and rational discussed.  Patient  opts for 3 year screening interval  3) Patient underwent screening for postpartum depression with no concerns noted.  4) Follow up 1 year for routine annual exam

## 2016-10-25 LAB — PAPIG, HPV, RFX 16/18
HPV, high-risk: NEGATIVE
PAP Smear Comment: 0

## 2020-11-03 ENCOUNTER — Other Ambulatory Visit (HOSPITAL_COMMUNITY): Payer: Self-pay | Admitting: Family Medicine

## 2020-11-03 ENCOUNTER — Other Ambulatory Visit: Payer: Self-pay | Admitting: Family Medicine

## 2020-11-03 DIAGNOSIS — R102 Pelvic and perineal pain: Secondary | ICD-10-CM

## 2020-11-05 ENCOUNTER — Other Ambulatory Visit: Payer: Self-pay

## 2020-11-05 ENCOUNTER — Ambulatory Visit
Admission: RE | Admit: 2020-11-05 | Discharge: 2020-11-05 | Disposition: A | Discharge status: Home / Self Care | Payer: No Typology Code available for payment source | Source: Ambulatory Visit | Attending: Family Medicine | Admitting: Family Medicine

## 2020-11-05 DIAGNOSIS — R102 Pelvic and perineal pain: Secondary | ICD-10-CM | POA: Diagnosis not present

## 2023-06-22 IMAGING — US US PELVIS COMPLETE WITH TRANSVAGINAL
1 series · 15 of 25 positions shown · non-contrast
Comparison: None

CLINICAL DATA: Pelvic pain

EXAM:
TRANSABDOMINAL AND TRANSVAGINAL ULTRASOUND OF PELVIS
TECHNIQUE: Both transabdominal and transvaginal ultrasound examinations of the
pelvis were performed. Transabdominal technique was performed for
global imaging of the pelvis including uterus, ovaries, adnexal
regions, and pelvic cul-de-sac. It was necessary to proceed with
endovaginal exam following the transabdominal exam to visualize the
uterus endometrium ovaries.

[Series 1: us pelvis complete · 15 of 65 slices shown]
[im 1/65]
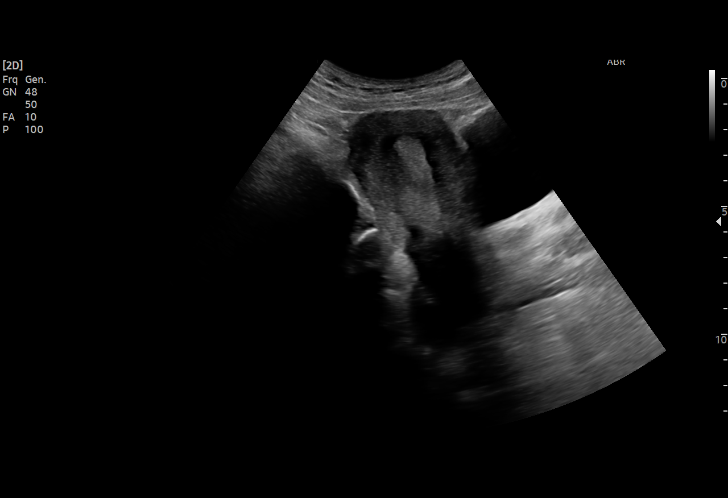
[im 6/65]
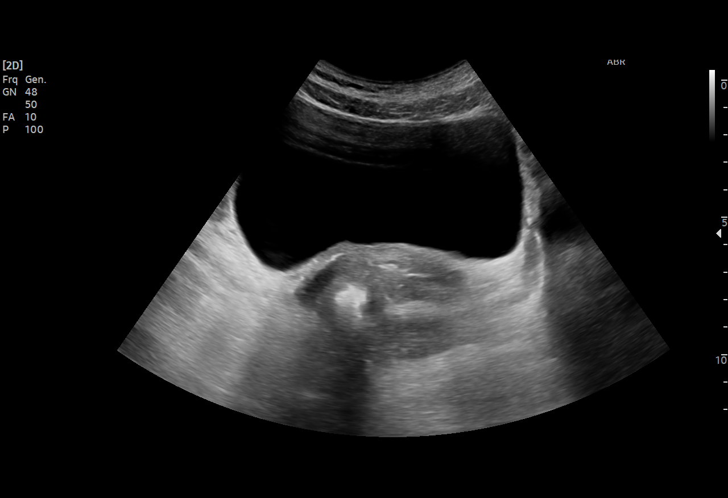
[im 11/65]
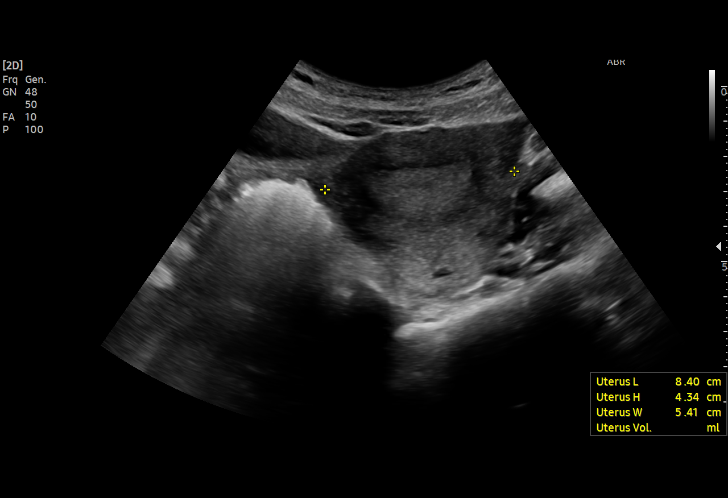
[im 14/65]
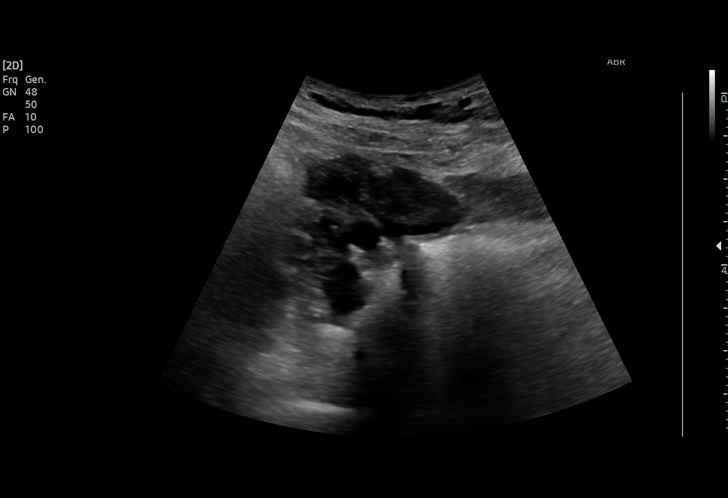
[im 19/65]
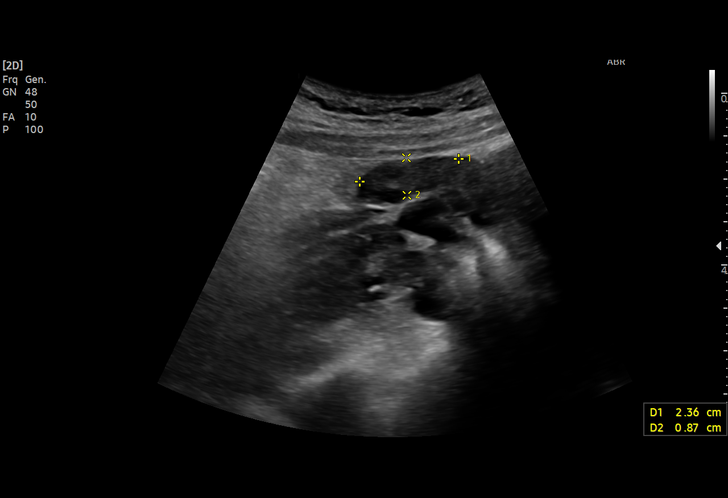
[im 25/65]
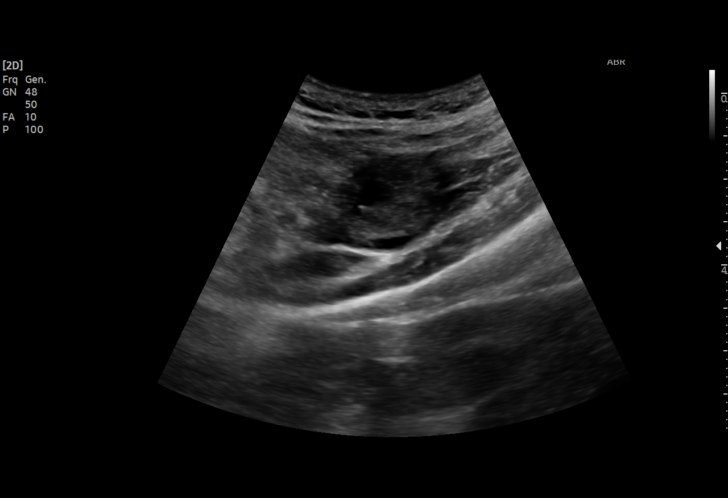
[im 27/65]
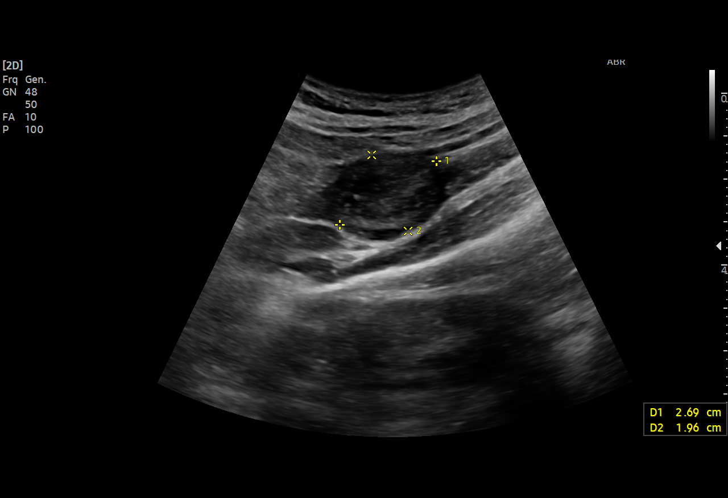
[im 33/65]
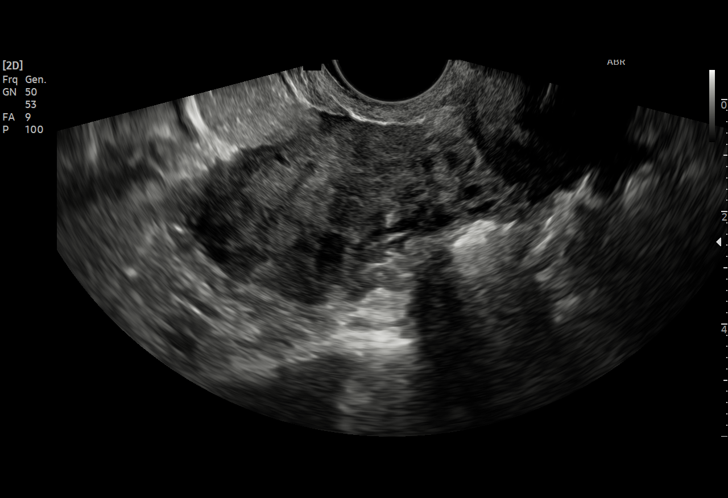
[im 38/65]
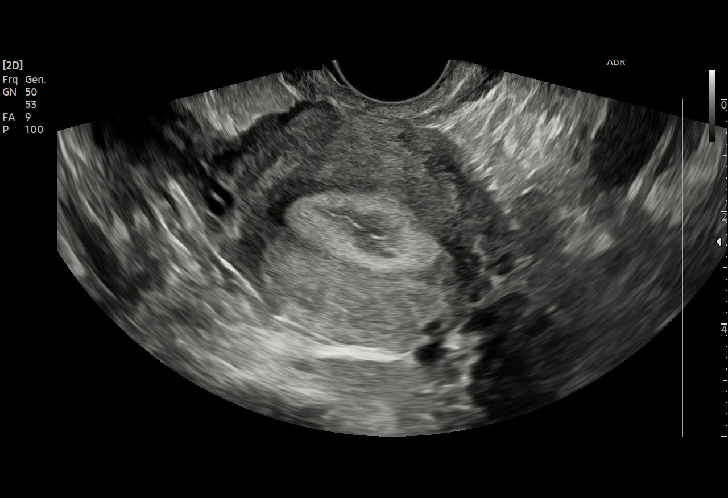
[im 41/65]
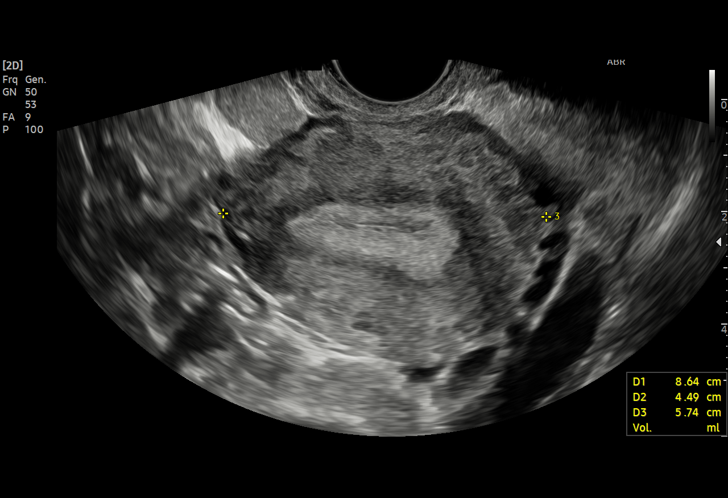
[im 46/65]
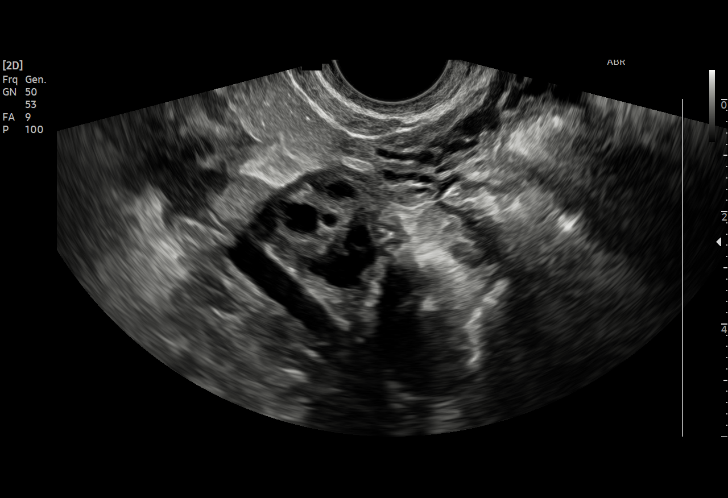
[im 51/65]
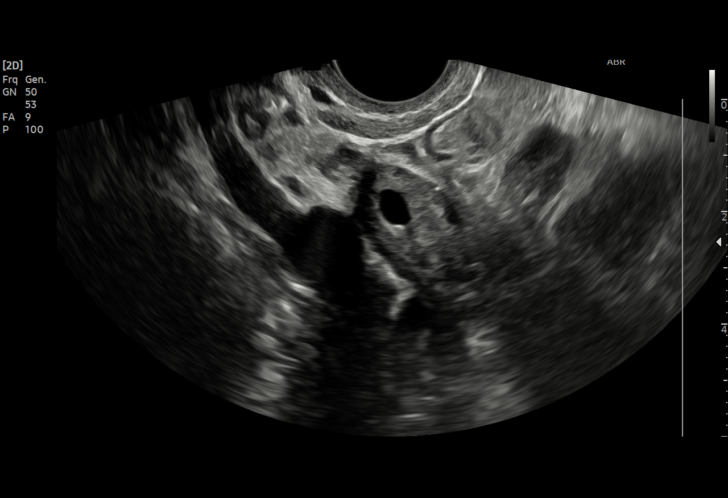
[im 54/65]
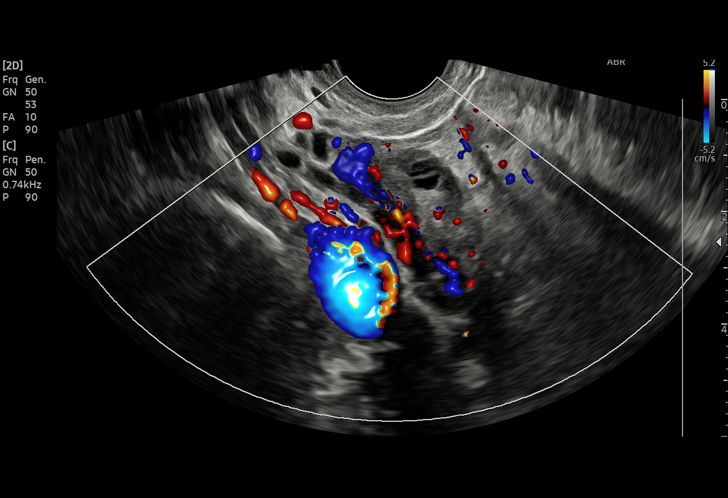
[im 59/65]
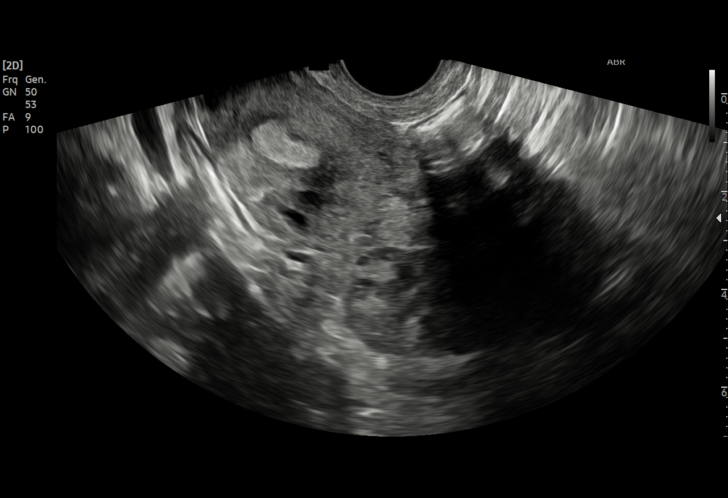
[im 65/65]
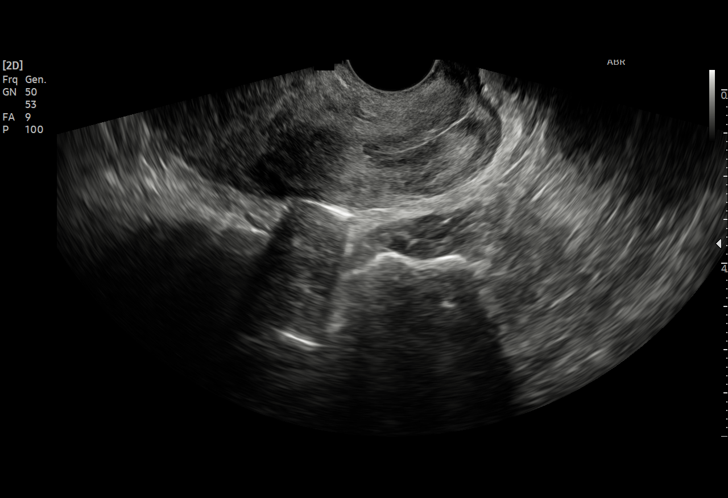

[15 of 25 positions shown; findings below may reference images not displayed]

FINDINGS: Uterus

Measurements: 8.6 x 4.5 x 5.7 cm = volume: 117 mL. No fibroids or
other mass visualized.

Endometrium

Thickness: 16 mm.  No focal abnormality visualized.

Right ovary

Measurements: 2.5 x 1.3 x 1.7 cm = volume: 2.8 mL. Normal
appearance/no adnexal mass.

Left ovary

Measurements: 2.3 x 2.1 x 2.7 cm = volume: 7 mL. Normal
appearance/no adnexal mass.

Other findings

No abnormal free fluid.
IMPRESSION: 1. Essentially negative pelvic ultrasound.
